# Patient Record
Sex: Female | Born: 1970 | Race: Black or African American | Hispanic: No | Marital: Married | Smoking: Never smoker
Health system: Southern US, Community
[De-identification: ages and names within clinical notes are randomized; demographics above are authoritative.]

## PROBLEM LIST (undated history)

## (undated) DIAGNOSIS — I10 Essential (primary) hypertension: Secondary | ICD-10-CM

## (undated) DIAGNOSIS — R002 Palpitations: Secondary | ICD-10-CM

## (undated) HISTORY — PX: CHOLECYSTECTOMY: SHX55

---

## 1998-02-11 ENCOUNTER — Inpatient Hospital Stay (HOSPITAL_COMMUNITY): Admission: AD | Admit: 1998-02-11 | Discharge: 1998-02-11 | Payer: Self-pay | Admitting: *Deleted

## 1998-02-11 ENCOUNTER — Inpatient Hospital Stay (HOSPITAL_COMMUNITY): Admission: AD | Admit: 1998-02-11 | Discharge: 1998-02-14 | Payer: Self-pay | Admitting: Obstetrics & Gynecology

## 1999-03-09 ENCOUNTER — Observation Stay (HOSPITAL_COMMUNITY): Admission: RE | Admit: 1999-03-09 | Discharge: 1999-03-10 | Payer: Self-pay | Admitting: General Surgery

## 1999-03-09 ENCOUNTER — Encounter (INDEPENDENT_AMBULATORY_CARE_PROVIDER_SITE_OTHER): Payer: Self-pay | Admitting: Specialist

## 1999-07-06 ENCOUNTER — Other Ambulatory Visit: Admission: RE | Admit: 1999-07-06 | Discharge: 1999-07-06 | Payer: Self-pay | Admitting: *Deleted

## 1999-09-06 ENCOUNTER — Encounter: Payer: Self-pay | Admitting: Obstetrics & Gynecology

## 1999-09-06 ENCOUNTER — Ambulatory Visit (HOSPITAL_COMMUNITY): Admission: RE | Admit: 1999-09-06 | Discharge: 1999-09-06 | Payer: Self-pay | Admitting: Obstetrics & Gynecology

## 1999-12-10 ENCOUNTER — Inpatient Hospital Stay (HOSPITAL_COMMUNITY): Admission: AD | Admit: 1999-12-10 | Discharge: 1999-12-10 | Payer: Self-pay | Admitting: Obstetrics and Gynecology

## 1999-12-11 ENCOUNTER — Inpatient Hospital Stay (HOSPITAL_COMMUNITY): Admission: AD | Admit: 1999-12-11 | Discharge: 1999-12-11 | Payer: Self-pay | Admitting: Obstetrics and Gynecology

## 1999-12-15 ENCOUNTER — Ambulatory Visit (HOSPITAL_COMMUNITY): Admission: RE | Admit: 1999-12-15 | Discharge: 1999-12-15 | Payer: Self-pay | Admitting: Obstetrics and Gynecology

## 1999-12-15 ENCOUNTER — Encounter: Payer: Self-pay | Admitting: Obstetrics and Gynecology

## 2000-01-30 ENCOUNTER — Inpatient Hospital Stay (HOSPITAL_COMMUNITY): Admission: AD | Admit: 2000-01-30 | Discharge: 2000-02-02 | Payer: Self-pay | Admitting: Obstetrics and Gynecology

## 2002-04-01 ENCOUNTER — Encounter: Payer: Self-pay | Admitting: Emergency Medicine

## 2002-04-01 ENCOUNTER — Inpatient Hospital Stay (HOSPITAL_COMMUNITY): Admission: EM | Admit: 2002-04-01 | Discharge: 2002-04-07 | Payer: Self-pay | Admitting: Emergency Medicine

## 2002-04-02 ENCOUNTER — Encounter: Payer: Self-pay | Admitting: Internal Medicine

## 2002-04-03 ENCOUNTER — Encounter: Payer: Self-pay | Admitting: Internal Medicine

## 2002-04-06 ENCOUNTER — Encounter (INDEPENDENT_AMBULATORY_CARE_PROVIDER_SITE_OTHER): Payer: Self-pay | Admitting: *Deleted

## 2003-02-07 ENCOUNTER — Emergency Department (HOSPITAL_COMMUNITY): Admission: EM | Admit: 2003-02-07 | Discharge: 2003-02-07 | Payer: Self-pay | Admitting: Emergency Medicine

## 2003-02-10 ENCOUNTER — Other Ambulatory Visit: Admission: RE | Admit: 2003-02-10 | Discharge: 2003-02-10 | Payer: Self-pay | Admitting: *Deleted

## 2003-10-08 ENCOUNTER — Inpatient Hospital Stay (HOSPITAL_COMMUNITY): Admission: AD | Admit: 2003-10-08 | Discharge: 2003-10-08 | Payer: Self-pay | Admitting: Obstetrics and Gynecology

## 2003-10-09 ENCOUNTER — Inpatient Hospital Stay (HOSPITAL_COMMUNITY): Admission: AD | Admit: 2003-10-09 | Discharge: 2003-10-11 | Payer: Self-pay | Admitting: Obstetrics and Gynecology

## 2003-11-24 ENCOUNTER — Ambulatory Visit (HOSPITAL_COMMUNITY): Admission: RE | Admit: 2003-11-24 | Discharge: 2003-11-24 | Payer: Self-pay | Admitting: Obstetrics and Gynecology

## 2006-09-09 ENCOUNTER — Emergency Department (HOSPITAL_COMMUNITY): Admission: EM | Admit: 2006-09-09 | Discharge: 2006-09-09 | Payer: Self-pay | Admitting: Emergency Medicine

## 2008-08-13 ENCOUNTER — Ambulatory Visit: Payer: Self-pay | Admitting: Diagnostic Radiology

## 2008-08-13 ENCOUNTER — Emergency Department (HOSPITAL_BASED_OUTPATIENT_CLINIC_OR_DEPARTMENT_OTHER): Admission: EM | Admit: 2008-08-13 | Discharge: 2008-08-13 | Payer: Self-pay | Admitting: Emergency Medicine

## 2010-07-14 NOTE — Op Note (Signed)
NAMEJAVONA, Erika Norris                         ACCOUNT NO.:  [id number]   MEDICAL RECORD NO.:  [id number]                   PATIENT TYPE:  INP   LOCATION:  4702                                 FACILITY:  [facility]   PHYSICIAN:  Casimiro Needle B. Sherene Sires, M.D. [facility]           DATE OF BIRTH:  Oct 10, 1970   DATE OF PROCEDURE:  04/06/2002  DATE OF DISCHARGE:                                 OPERATIVE REPORT   REFERRED BY:  Deirdre Peer. Polite, M.D.   PROCEDURE:  Fiberoptic bronchoscopy, diagnostic, with lavage.   SURGEON:  Charlaine Dalton. Sherene Sires, M.D.   INDICATIONS FOR PROCEDURE:  This is a 40 year old black female who never  smoked with a new onset of hemoptysis, with infiltrates in the right base  posteriorly (representing alveolar hemorrhage versus pneumonia).  Because  she has had persistent hemoptysis with no alternative explanation, a  bronchoscopy is felt to be indicated to explore the endobronchial anatomy,  specifically the airways leading to the right lower lobe, to be sure that  there is not an endobronchial source of bleeding.  The procedure was performed after a full discussion of the risks, benefits  and alternatives with the patient.   DESCRIPTION OF PROCEDURE:  She received a total of 50 mg of IV Demerol and 5  mg of IV Versed while being continuously monitored by surface  electrocardiogram and oximetry.  Using a standard fiberoptic bronchoscope,  the right naris was initially approached and was easily passed using  additional 2% lidocaine topically.  The oropharynx was well visualized, and  there were no apparent upper airway lesions, specifically no indication of  active upper airway bleeding or source identified.  Using an additional 1% lidocaine as needed, the entire tracheobronchial tree  was explored bilaterally with the following findings:  1. Trachea and all the major airways bilaterally were explored to the sixth     segment level, with no active bleeding identified.  2. The only  abnormality appeared in the posterior basilar segment of the     right lower lobe where there appeared to be a bloody mucus plug that     was nothing more than I believe a clot which was lavaged free.  This     particular segment was lavaged gently with adequate return of slightly     bloody fluid, but no evidence of pus.  In fact, none of the airways even     appeared significantly inflamed.   IMPRESSION:  Hemoptysis with a blood clot in the right lower lobe, posterior  basilar segment which may have simply been aspirated blood from another  source; however, no evidence of upper or lower airway source of bleeding,  nor purulence or inflammation.    RECOMMENDATIONS:  Therefore the explanation for this patient's hemoptysis  remains enigmatic, and we will need to follow her closely as an outpatient,  to be sure that she does not have recurrence.  For now  I would simply treat her as an acute community-acquired pneumonia as  you planned, and she does have my phone number to call for an office visit  within a week for a follow-up chest x-ray.                                                Charlaine Dalton. Sherene Sires, M.D. [facility]    MBW/MEDQ  D:  04/06/2002  T:  04/06/2002  Job:  [id number]   cc:   Deirdre Peer. Polite, M.D.  [address]  [place], [place] [address]  Fax: [phone]

## 2010-07-14 NOTE — Op Note (Signed)
NAMESEBASTIAN, DZIK             ACCOUNT NO.:  [id number]   MEDICAL RECORD NO.:  [id number]          PATIENT TYPE:  AMB   LOCATION:  SDC                           FACILITY:  [facility]   PHYSICIAN:  Maxie Better, M.D.DATE OF BIRTH:  1970/09/02   DATE OF PROCEDURE:  11/24/2003  DATE OF DISCHARGE:                                 OPERATIVE REPORT   PREOPERATIVE DIAGNOSIS:  Desires sterilization.   PROCEDURE:  Laparoscopic tubal ligation with bipolar cautery.   POSTOPERATIVE DIAGNOSIS:  Desires sterilization.   ANESTHESIA:  General.   SURGEON:  Maxie Better, M.D.   FINDINGS:  Normal retroverted uterus, normal tubes and ovaries, normal  appendix.  No evidence of endometriosis.   PROCEDURE IN DETAIL:  Under adequate general anesthesia, the patient was  placed in the dorsal lithotomy position.  She was sterilely prepped and  draped in the usual fashion and indwelling Foley catheter was placed.  Examination under anesthesia confirmed a retroverted uterus, no adnexal  masses could be appreciated.  A bivalve speculum was placed in the vagina.  A single-tooth tenaculum was placed in the anterior lip of the cervix.  An  acorn cannula was introduced into the cervical os and attached to the  tenaculum for manipulation of the uterus.  The bivalve speculum was removed,  0.25% Marcaine was injected infra-umbilically.  An infra-umbilical incision  was then made.  Veress needle was introduced and tested with good placement,  3.5 L of carbon dioxide was insufflated.  The Veress needle was removed.  A  disposable trocar was introduced without incident.  The lighted video  laparoscopic was then introduced, confirming entry into the abdomen without  incident.  The liver edge was inspected.  The patient was placed in  Trendelenburg.  The suprapubic incision was then made under direct  visualization.  A 5 mm port was placed.  A probe was then used to inspect  the pelvis.  Normal appendix was  noted.  Normal tubes and ovaries were  noted.  Prominent blood vessels were noted in the IP region on the right.  No endometria noted anteriorly or posteriorly.  Bipolar cautery was done  bilaterally in the midportion of both fallopian tubes and that was felt to  be satisfactory.  The suprapubic port was removed under direct  visualization.  The abdomen deflated.  The infraumbilical port was then  removed under direct visualization, the abdomen further deflated.  The  infraumbilical port was closed with a deep layer of stitch of 0 Vicryl  figure-of-eight.  The skin incisions were approximated using Dermabond.  The  instruments in the vagina were removed.   SPECIMENS:  None.   ESTIMATED BLOOD LOSS:  Minimal.   COMPLICATIONS:  None.   DISPOSITION:  The patient tolerated the procedure well and was transferred  to recovery room in stable condition.      Lad[place]/MEDQ  D:  11/24/2003  T:  11/24/2003  Job:  [id number]

## 2010-07-14 NOTE — H&P (Signed)
[facility]  Patient:    Erika Norris                      MRN: [id number] Adm. Date:  [id number] Attending:  Shaune Spittle Dictator:   Wynelle Bourgeois, CNM                         History and Physical  HISTORY OF PRESENT ILLNESS:   [name] is a 40 year old G2, para 1-0-0-1 at 40-2/7ths weeks, who presents to [facility]U with complaints of regular uterine contractions x 2 hours.  She denies any leaking or bleeding and reports positive fetal movement.  The pregnancy has been followed by the M.D. service and remarkable for: 1. History of PIH previous pregnancy.  2. History of positive GBS.  3. Low weight gain with an EFW in the 37th percentile.  PRENATAL LABORATORIES:        Hemoglobin 11.8, hematocrit 34.2, platelets 264. Blood type O positive.  Antibody screen negative.  Sickle cell negative.  RPR nonreactive.  Rubella immune.  HBsAg negative.  HIV nonreactive.  Pap test normal.  Gonorrhea negative.  Chlamydia negative.  AFP normal.  Glucose challenge normal.  OBSTETRICAL HISTORY:          Remarkable for a vaginal delivery at term with no complications, ? year (records unavailable).  MEDICAL HISTORY:              Remarkable for a a history of cholecystitis which was treated with a cholecystectomy, history of group B strep in previous pregnancy, history of PIH in a previous pregnancy.  FAMILY HISTORY:               Noncontributory.  SOCIAL HISTORY:               The patient is married to [place] who is involved and supportive.  She denies any alcohol, tobacco or drug use.  PHYSICAL EXAMINATION:  VITAL SIGNS:                  Vital signs are stable, patient is afebrile.  HEENT:                        Within normal limits.  NECK:                         Thyroid normal, not enlarged.  BREASTS:                      Soft, nontender, no masses.  CHEST:                        Clear to auscultation bilaterally.  HEART RATE:                    Regular rate and rhythm.  ABDOMEN:                      Gravid, vertex to Leop[place], EFM is reactive with positive accelerations, no decelerations.  Uterine contractions every four minutes, moderately strong.  PELVIC:                       Cervical exam was 4 cm, 90% effaced and -1 station which changed to 4-5 cm, 90% and -1 to 0 station after one hour of  walking.  EXTREMITIES:                  Within normal limits.  ASSESSMENT:                   1. Intrauterine pregnancy at 40-2/7ths weeks.                               2. Early labor.  PLAN:                         1. Admit to birthing suites per Dr. Pennie Rushing.                               2. Routine M.D. orders.                               3. Further orders per Dr. Pennie Rushing. DD:  01/31/00 TD:  01/31/00 Job: [id number]

## 2010-07-14 NOTE — Discharge Summary (Signed)
Erika Norris, Erika Norris                         ACCOUNT NO.:  [id number]   MEDICAL RECORD NO.:  [id number]                   PATIENT TYPE:  INP   LOCATION:  4702                                 FACILITY:  [facility]   PHYSICIAN:  Ashby Dawes. Polite, M.D.              DATE OF BIRTH:  [birth year]   DATE OF ADMISSION:  04/01/2002  DATE OF DISCHARGE:  04/07/2002                                 DISCHARGE SUMMARY   DISCHARGE DIAGNOSES:  1. Hemoptysis, unknown etiology.  2. Probable community acquired pneumonia.   DISCHARGE MEDICATIONS:  Augmentin 500 mg twice a day for four more days.   CONSULTATIONS:  Dr. Christinia Gully, pulmonology.   PROCEDURES AND STUDIES:  1. A chest x-ray April 01, 2002 - no active disease.  2. A CT of the chest with contrast on April 01, 2002 revealed an     infiltrate in the posterior right lower lobe.  No evidence of mass or     adenopathy.  3. Repeat chest x-ray April 02, 2002 - stable chest, right lower lobe     infiltrate, seen by CT is barely discernable.  4. 12  lead EKG April 03, 2002 - normal sinus rhythm.  5. Fiberoptic bronchoscopy April 06, 2002 by Dr. Clois Comber.  Impression:     Hemoptysis with a blood clot in the right lower lobe posterior basilar     segment which may have simply been aspirated blood from another source     however, no evidence of upper or lower airway source of bleeding nor     purulent or inflammation.  Recommendations:  To treat as an acute     community acquired pneumonia.  Followup with a chest x-ray within one     week.   LABORATORY DATA:  Hemoglobin 10.4, hematocrit 30.3, sodium 137, potassium  3.8, chloride 109, CO2 23, glucose 111, BUN 6, creatinine 0.8.  ESR 15.  Urinalysis negative.  ANA negative.  ANCA negative.  ANCA/GBM  negative.   DISPOSITION:  The patient will be discharged home.   CONDITION ON DISCHARGE:  Stable.   HISTORY OF PRESENT ILLNESS:  This is a 40 year old female who presented to  [facility] ED  with complaints of coughing up blood on several occasions on  the day of admission associated with some shortness of breath.  The patient  did report recent sick contact with her daughter and her mother-in-law  approximately one week prior.  The patient reported occasional use of  aspirin for menstrual cramps.  The initial CT of the chest revealed patchy  infiltrate in the right lower lobe, no mass or effusion.  The patient was  admitted for further evaluation and treatment.   HOSPITAL COURSE:  1. HEMOPTYSIS.  Initial differential diagnoses include an infection,     pulmonary mass, collagen vascular disease and possible PE.  The initial     CT revealed patchy infiltrate of the  right lower lobe, no mass or     effusion.  Rheumatologic work up was initiated.  Work up came back     negative.  Coag's were checked and were within normal limits.  Empiric     antibiotic therapy was initiated.  Pulmonary consult was obtained.  The     patient was seen in consultation by Dr. Christinia Gully.  A fiberoptic     bronchoscopy was performed on April 06, 2002.  No active bleeding was     identified.  There was a blood and mucus plug in the posterior basilar     segment of the right lower lobe and it was lavaged free.  There was no     noted pus or inflammation.  Recommendations were to treat as an acute     community acquired pneumonia.  We treated the patient for community     acquired pneumonia.  Will followup an office visit and chest x-ray within     one week.  The patient had no further hemoptysis throughout her     hospitalization, no shortness of breath.  2. PROBABLE COMMUNITY ACQUIRED PNEUMONIA.  Initially the patient was started     on __________  IV every eight hours.  The patient remained afebrile     during her hospitalization.  She has been discharged on an additional     four days of antibiotic therapy to complete a 10 day antibiotic course.     She will have a repeat chest x-ray in one  week.   DISCHARGE INSTRUCTIONS AND FOLLOWUP:  She has a followup appointment with  her primary care physician, Dr. Jacklynn Ganong, on February 18.  She  should followup in Dr. Gustavus Bryant office in one week with a followup chest x-  ray.     Stephanie Martinique, NP                      Ashby Dawes. Polite, M.D.    SJ/MEDQ  D:  04/07/2002  T:  04/07/2002  Job:  [id number]   cc:   Legrand Como B. Melvyn Novas, M.D. [facility]  [address] [address]  Fax: 1

## 2011-04-11 ENCOUNTER — Emergency Department (HOSPITAL_COMMUNITY)
Admission: EM | Admit: 2011-04-11 | Discharge: 2011-04-11 | Disposition: A | Discharge status: Home / Self Care | Payer: 59 | Attending: Emergency Medicine | Admitting: Emergency Medicine

## 2011-04-11 ENCOUNTER — Emergency Department (HOSPITAL_COMMUNITY): Payer: 59

## 2011-04-11 ENCOUNTER — Encounter (HOSPITAL_COMMUNITY): Payer: Self-pay | Admitting: *Deleted

## 2011-04-11 DIAGNOSIS — M545 Low back pain, unspecified: Secondary | ICD-10-CM | POA: Insufficient documentation

## 2011-04-11 DIAGNOSIS — M542 Cervicalgia: Secondary | ICD-10-CM | POA: Insufficient documentation

## 2011-04-11 DIAGNOSIS — M546 Pain in thoracic spine: Secondary | ICD-10-CM | POA: Insufficient documentation

## 2011-04-11 DIAGNOSIS — Z79899 Other long term (current) drug therapy: Secondary | ICD-10-CM | POA: Insufficient documentation

## 2011-04-11 DIAGNOSIS — M25519 Pain in unspecified shoulder: Secondary | ICD-10-CM | POA: Insufficient documentation

## 2011-04-11 DIAGNOSIS — I10 Essential (primary) hypertension: Secondary | ICD-10-CM | POA: Insufficient documentation

## 2011-04-11 HISTORY — DX: Essential (primary) hypertension: I10

## 2011-04-11 HISTORY — DX: Palpitations: R00.2

## 2011-04-11 MED ORDER — IBUPROFEN 800 MG PO TABS: 800.0000 mg | ORAL_TABLET | Freq: Three times a day (TID) | ORAL | Status: AC | PRN

## 2011-04-11 MED ORDER — HYDROCODONE-ACETAMINOPHEN 5-325 MG PO TABS: 1.0000 | ORAL_TABLET | Freq: Four times a day (QID) | ORAL | Status: AC | PRN

## 2011-04-11 MED ORDER — OXYCODONE-ACETAMINOPHEN 5-325 MG PO TABS
1.0000 | ORAL_TABLET | Freq: Once | ORAL | Status: AC
  Administered 2011-04-11: 1 via ORAL
  Filled 2011-04-11: qty 1

## 2011-04-11 MED ORDER — DIAZEPAM 5 MG PO TABS
5.0000 mg | ORAL_TABLET | Freq: Once | ORAL | Status: AC
  Administered 2011-04-11: 5 mg via ORAL
  Filled 2011-04-11: qty 1

## 2011-04-11 MED ORDER — DIAZEPAM 5 MG PO TABS: 5.0000 mg | ORAL_TABLET | Freq: Three times a day (TID) | ORAL | Status: AC | PRN

## 2011-04-11 MED ORDER — IBUPROFEN 200 MG PO TABS
600.0000 mg | ORAL_TABLET | Freq: Once | ORAL | Status: AC
  Administered 2011-04-11: 600 mg via ORAL
  Filled 2011-04-11: qty 2
  Filled 2011-04-11: qty 1
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
## 2011-04-11 NOTE — ED Provider Notes (Signed)
Medical screening examination/treatment/procedure(s) were performed by non-physician practitioner and as supervising physician I was immediately available for consultation/collaboration.   Berlynn Warsame, MD 04/11/11 1616 

## 2014-09-20 ENCOUNTER — Ambulatory Visit: Payer: BLUE CROSS/BLUE SHIELD | Admitting: Skilled Nursing Facility1

## 2017-04-24 ENCOUNTER — Ambulatory Visit (INDEPENDENT_AMBULATORY_CARE_PROVIDER_SITE_OTHER): Payer: Self-pay | Admitting: Orthopedic Surgery

## 2017-04-24 ENCOUNTER — Encounter (INDEPENDENT_AMBULATORY_CARE_PROVIDER_SITE_OTHER): Payer: Self-pay | Admitting: Orthopedic Surgery

## 2017-04-24 ENCOUNTER — Ambulatory Visit (INDEPENDENT_AMBULATORY_CARE_PROVIDER_SITE_OTHER): Payer: Self-pay

## 2017-04-24 DIAGNOSIS — M25562 Pain in left knee: Secondary | ICD-10-CM

## 2017-04-24 NOTE — Progress Notes (Signed)
Office Visit Note   Patient: Erika Norris           Date of Birth: 12/06/1970           MRN: [id number] Visit Date: 04/24/2017 Requested by: Juanita Lasteripton, John S, MD [address] [place], [place] [id number] PCP: Juanita Lasteripton, John S, MD  Subjective: Chief Complaint  Patient presents with  . Left Knee - Pain    HPI: Erika Norris is a 47 year old patient with left knee pain.  Reports pain since early December after walking on a brick sidewalk.  Her foot got caught and she twisted her knee.  She had initial swelling but that swelling improved.  Now she reports recurrent swelling and pain.  She recently tried to start exercising for weight loss but had to stop because that made her knee pain increase.  This pain does wake her from sleep at night.  She reports weakness and swelling and tightness.  Hard for her to bend the knee back.  Reports occasional locking but more pain and swelling.  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              ROS: All systems reviewed are negative as they relate to the chief complaint within the history of present illness.  Patient denies  fevers or chills.   Assessment & Plan: Visit Diagnoses:  1. Left knee pain, unspecified chronicity     Plan: Impression is left knee pain and effusion with relatively normal radiographs.  Does have a history of pulmonary embolism in 2004.  Aspiration and injection of the left knee is performed today.  She also needs MRI scan to evaluate for chondral defect versus meniscal pathology.  I could not get a great examination on her ACL but it felt stable on the exam I got.  Follow-Up Instructions: Return for after MRI.   Orders:  Orders Placed This Encounter  Procedures  . XR KNEE 3 VIEW LEFT  . MR Knee Left w/o contrast   No orders of the defined types were placed in this encounter.     Procedures: No procedures performed   Clinical Data: No additional findings.  Objective: Vital Signs: There  were no vitals taken for this visit.  Physical Exam:   Constitutional: Patient appears well-developed HEENT:  Head: Normocephalic Eyes:EOM are normal Neck: Normal range of motion Cardiovascular: Normal rate Pulmonary/chest: Effort normal Neurologic: Patient is alert Skin: Skin is warm Psychiatric: Patient has normal mood and affect    Ortho Exam: Orthopedic exam demonstrates normal gait and alignment.  Does have moderate left knee effusion.  ACL feels stable but it is difficult to assess due to guarding.  Collateral cruciate ligaments intact and extensor mechanism is intact.  Pedal pulses palpable.  No other masses lymphadenopathy or skin changes noted in that left knee region.  McMurray compression testing equivocal.  PCL is intact.  Specialty Comments:  No specialty comments available.  Imaging: Xr Knee 3 View Left  Result Date: 04/24/2017 AP lateral merchant left knee reviewed.  No fracture or dislocation is seen.  Varus alignment is present.  No effusion is present.  Mild spurring off the medial tibial plateau on the left knee.    PMFS History: There are no active problems to display for this patient.  Past Medical History:  Diagnosis Date  . Heart palpitations   . Hypertension     History reviewed. No pertinent family history.  Past Surgical History:  Procedure  Laterality Date  . CHOLECYSTECTOMY     Social History   Occupational History  . Not on file  Tobacco Use  . Smoking status: Never Smoker  Substance and Sexual Activity  . Alcohol use: Yes    Comment: occ  . Drug use: No  . Sexual activity: Not on file

## 2017-05-11 ENCOUNTER — Other Ambulatory Visit: Payer: Self-pay

## 2017-05-14 ENCOUNTER — Ambulatory Visit
Admission: RE | Admit: 2017-05-14 | Discharge: 2017-05-14 | Disposition: A | Discharge status: Home / Self Care | Payer: No Typology Code available for payment source | Source: Ambulatory Visit | Attending: Orthopedic Surgery | Admitting: Orthopedic Surgery

## 2017-05-14 DIAGNOSIS — M25562 Pain in left knee: Secondary | ICD-10-CM

## 2017-05-17 ENCOUNTER — Ambulatory Visit (INDEPENDENT_AMBULATORY_CARE_PROVIDER_SITE_OTHER): Payer: Self-pay | Admitting: Orthopedic Surgery

## 2017-05-22 ENCOUNTER — Ambulatory Visit (INDEPENDENT_AMBULATORY_CARE_PROVIDER_SITE_OTHER): Payer: Self-pay | Admitting: Orthopedic Surgery

## 2017-05-22 ENCOUNTER — Encounter (INDEPENDENT_AMBULATORY_CARE_PROVIDER_SITE_OTHER): Payer: Self-pay | Admitting: Orthopedic Surgery

## 2017-05-22 DIAGNOSIS — M25562 Pain in left knee: Secondary | ICD-10-CM

## 2017-05-22 NOTE — Progress Notes (Signed)
   Office Visit Note   Patient: Erika BarkerLynnell Norris           Date of Birth: 01/07/1971           MRN: [id number] Visit Date: 05/22/2017 Requested by: Juanita Lasteripton, John S, MD [address] [place], [place] [id number] PCP: Juanita Lasteripton, John S, MD  Subjective: Chief Complaint  Patient presents with  . Left Knee - Follow-up    HPI: Patient presents for follow-up of left knee pain.  Since I have seen her she has had an MRI scan.  She states that after the aspiration and injection she is doing better.  Currently taking no medication for the problem.  She does report some global pain but it is manageable at this time.  MRI scan shows meniscal root degeneration without discrete tear.  Fairly minimal arthritis in the remaining portion of her knee              ROS: All systems reviewed are negative as they relate to the chief complaint within the history of present illness.  Patient denies  fevers or chills.   Assessment & Plan: Visit Diagnoses:  1. Left knee pain, unspecified chronicity     Plan: Impression is left knee pain meniscal root degeneration.  Plan is observation and activity modification to avoid excessive loadbearing.  She is at risk for having a complete meniscal root tear.  Increase in pain and swelling would be indications for further evaluation.  I will see her back as needed  Follow-Up Instructions: Return if symptoms worsen or fail to improve.   Orders:  No orders of the defined types were placed in this encounter.  No orders of the defined types were placed in this encounter.     Procedures: No procedures performed   Clinical Data: No additional findings.  Objective: Vital Signs: There were no vitals taken for this visit.  Physical Exam:   Constitutional: Patient appears well-developed HEENT:  Head: Normocephalic Eyes:EOM are normal Neck: Normal range of motion Cardiovascular: Normal rate Pulmonary/chest: Effort normal Neurologic: Patient is alert Skin:  Skin is warm Psychiatric: Patient has normal mood and affect    Ortho Exam: Orthopedic exam demonstrates full range of motion of the left knee with no effusion.  No real focal joint line tenderness is present.  Patella mobility is good.  Collateral cruciate ligaments are stable.  Pedal pulses palpable.  Specialty Comments:  No specialty comments available.  Imaging: No results found.   PMFS History: There are no active problems to display for this patient.  Past Medical History:  Diagnosis Date  . Heart palpitations   . Hypertension     History reviewed. No pertinent family history.  Past Surgical History:  Procedure Laterality Date  . CHOLECYSTECTOMY     Social History   Occupational History  . Not on file  Tobacco Use  . Smoking status: Never Smoker  . Smokeless tobacco: Never Used  Substance and Sexual Activity  . Alcohol use: Yes    Comment: occ  . Drug use: No  . Sexual activity: Not on file

## 2018-09-16 IMAGING — MR MR KNEE*L* W/O CM
5 series · 33 of 40 positions shown · non-contrast
Comparison: None.

CLINICAL DATA: Left knee pain and swelling since January 2017. No
known injury.

EXAM:
MRI OF THE LEFT KNEE WITHOUT CONTRAST
TECHNIQUE: Multiplanar, multisequence MR imaging of the knee was performed. No
intravenous contrast was administered.

[Series 8: PD fat-sat · axial · left · 3.5mm · 0.39mm/px · z∈[-38,+92]mm · 8 of 32 slices shown (1 of 3)]
[im 1/32]
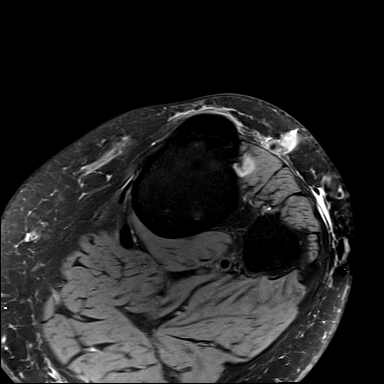
[im 5/32]
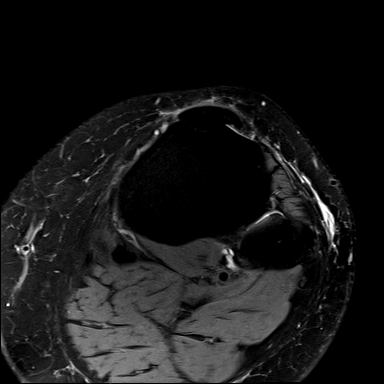
[im 9/32]
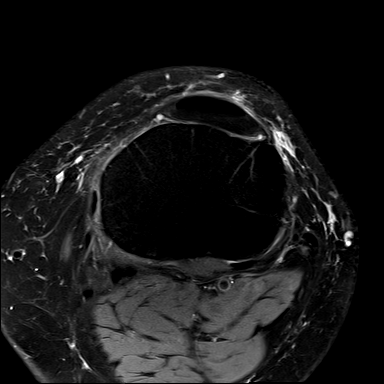
[im 14/32]
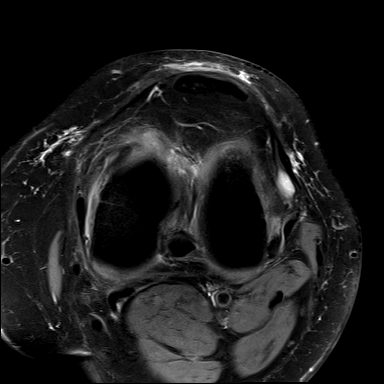
[im 18/32]
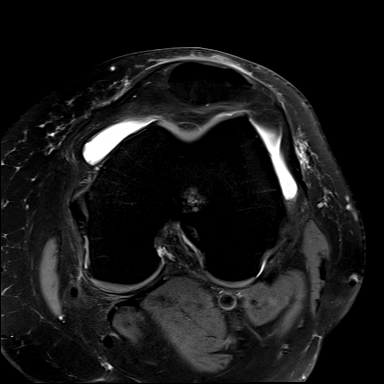
[im 23/32]
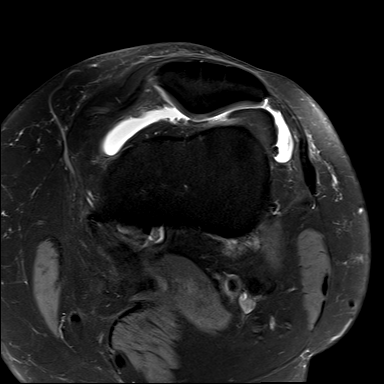
[im 27/32]
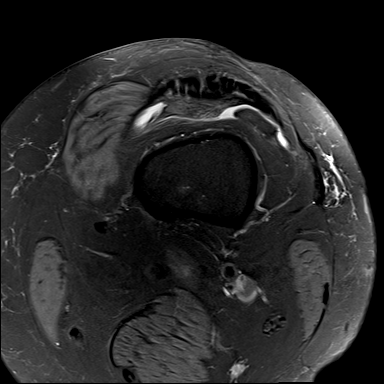
[im 32/32]
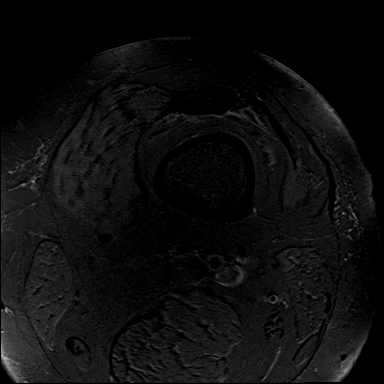

[Series 9: [id number] · coronal · left · 3.5mm · 0.39mm/px · 1 of 26 slices shown]
[im 1/26]
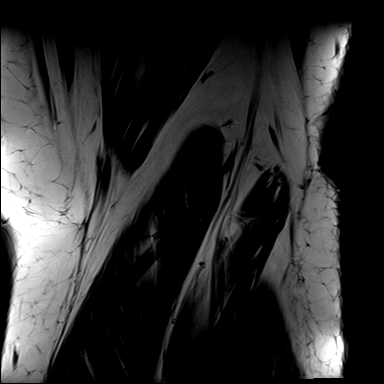

[Series 10: PD fat-sat · coronal · left · 3.5mm · 0.39mm/px · 8 of 26 slices shown (2 of 3)]
[im 1/26]
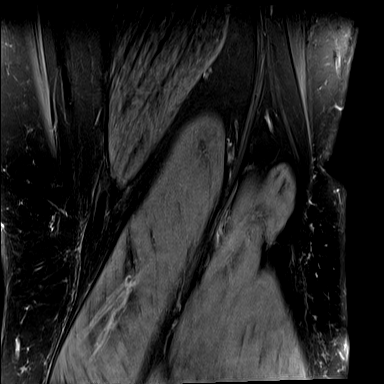
[im 4/26]
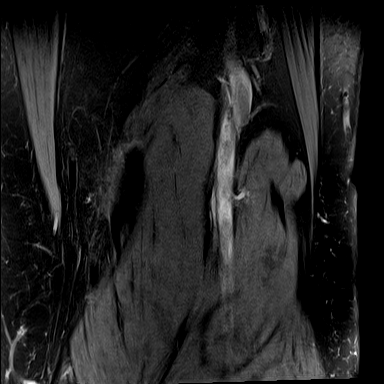
[im 8/26]
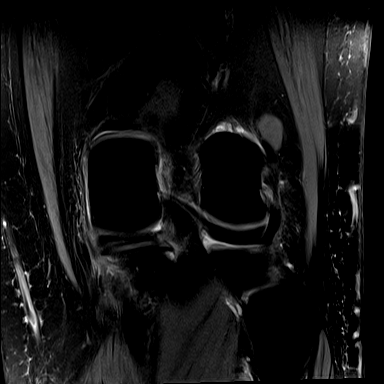
[im 11/26]
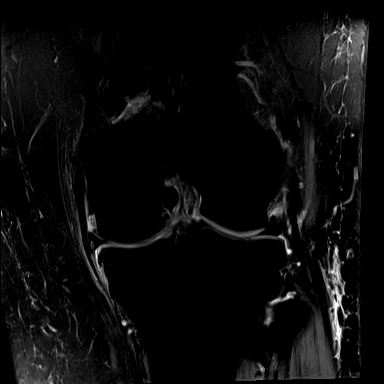
[im 15/26]
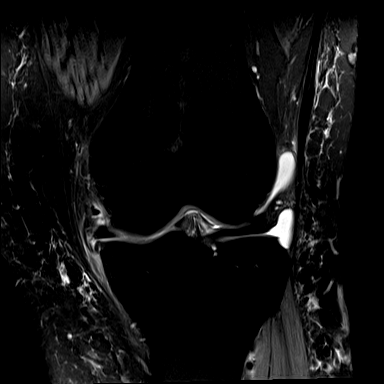
[im 18/26]
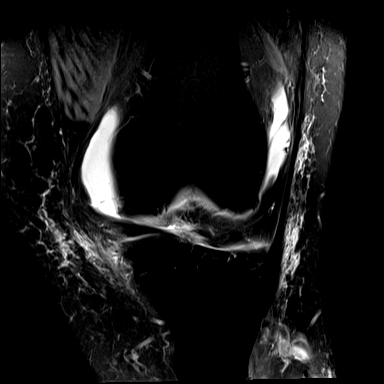
[im 22/26]
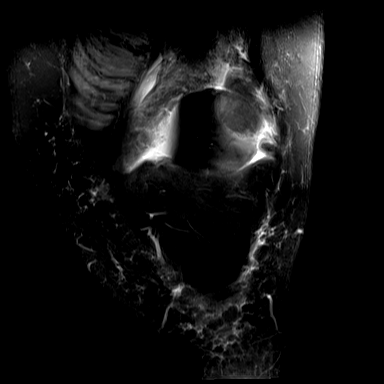
[im 26/26]
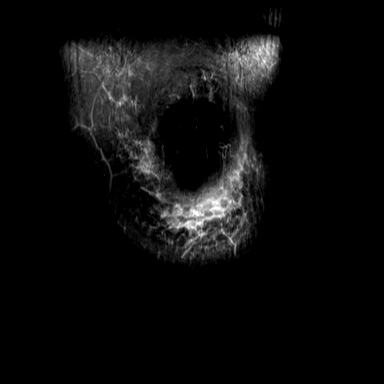

[Series 11: PD fat-sat · sagittal · left · 3.3mm · 0.39mm/px · 8 of 27 slices shown (3 of 3)]
[im 1/27]
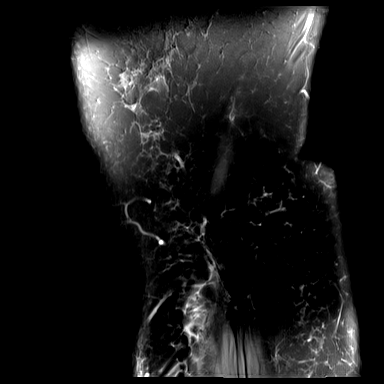
[im 4/27]
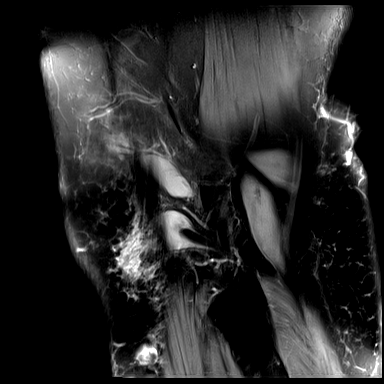
[im 8/27]
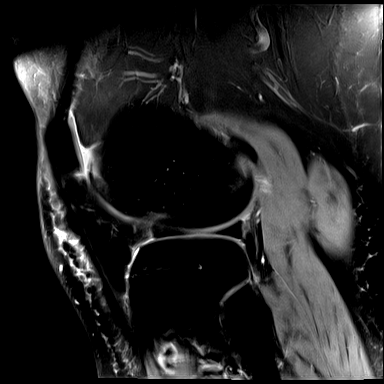
[im 12/27]
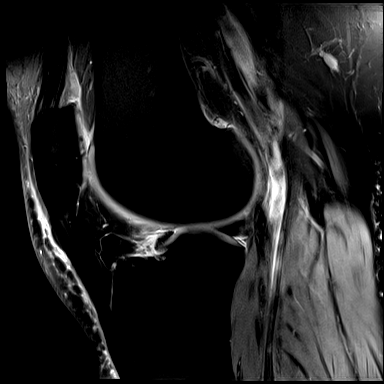
[im 15/27]
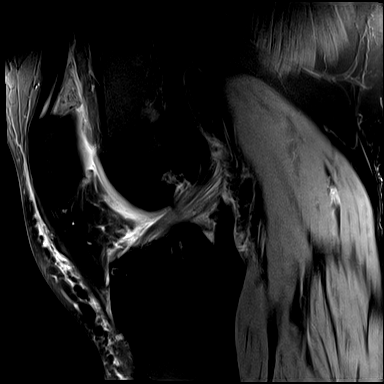
[im 19/27]
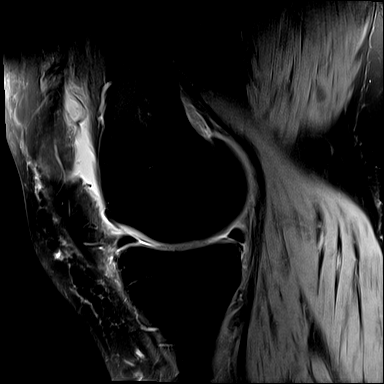
[im 23/27]
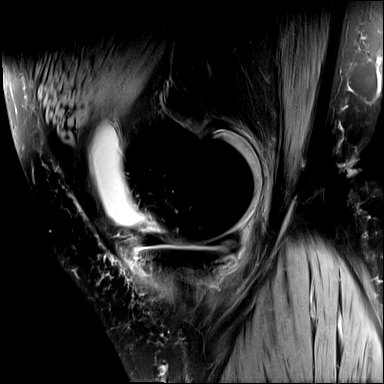
[im 27/27]
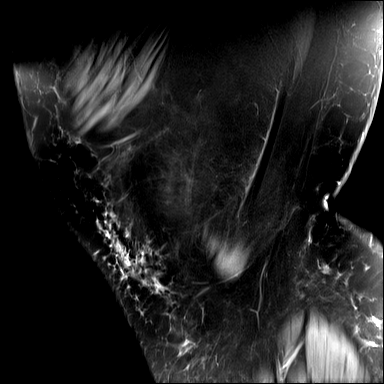

[Series 12: T2 fat-sat · coronal · left · 3.5mm · 0.39mm/px · 8 of 26 slices shown]
[im 1/26]
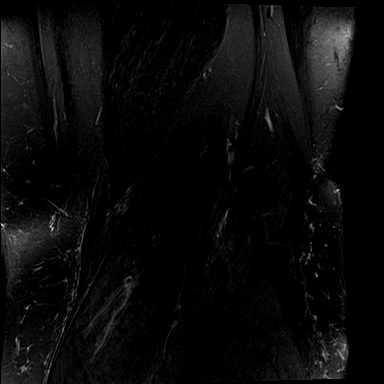
[im 4/26]
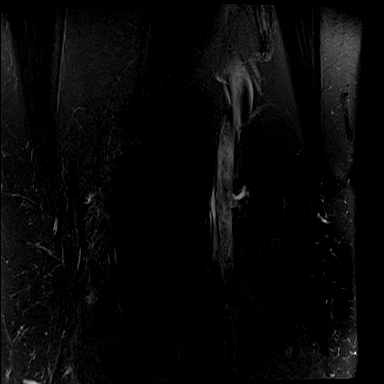
[im 8/26]
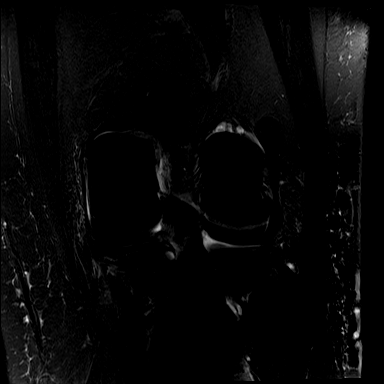
[im 11/26]
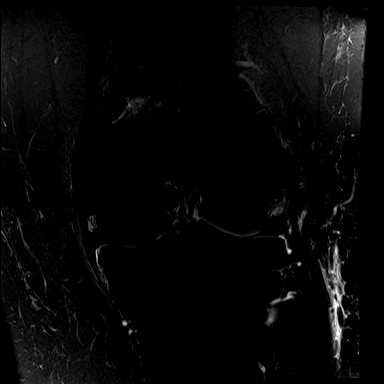
[im 15/26]
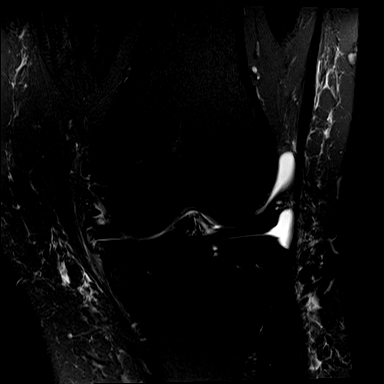
[im 18/26]
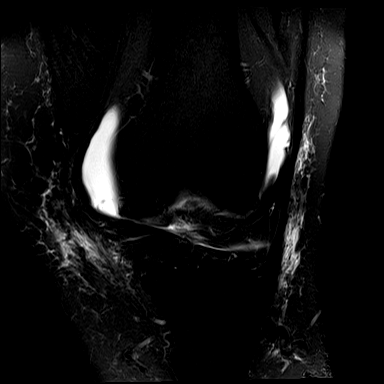
[im 22/26]
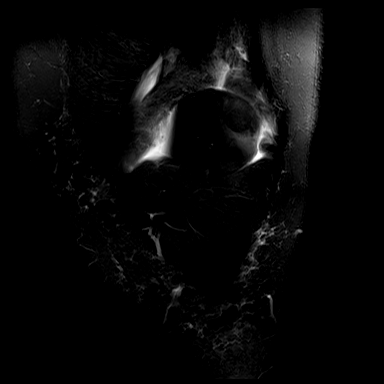
[im 26/26]
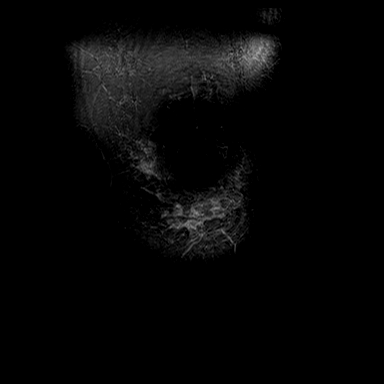

[33 of 40 positions shown; findings below may reference images not displayed]

FINDINGS: MENISCI

Medial meniscus: Irregularity and increased signal within the
posterior root, concerning for tear. Borderline extrusion of the
body. Degenerative signal within the body/posterior horn junction
without discrete tear

Lateral meniscus:  Intact.

LIGAMENTS

Cruciates:  Intact ACL and PCL.

Collaterals: Medial collateral ligament is intact. Lateral
collateral ligament complex is intact.

CARTILAGE

Patellofemoral: Focal partial-thickness fissuring over the patellar
apex extending into the lateral facet. High-grade focal
partial-thickness cartilage loss over the medial trochlea.

Medial: Thinning of the cartilage over the central weight-bearing
medial femoral condyle and medial tibial plateau. No focal defect.

Lateral: Focal high-grade partial-thickness cartilage loss along the
anterolateral femoral condyle with underlying subchondral marrow
edema. Fissuring over the posterior lateral tibial plateau.

Joint: Small joint effusion. Normal Hoffa's fat. No plical
thickening.

Popliteal Fossa:  No Baker cyst. Intact popliteus tendon.

Extensor Mechanism: Intact quadriceps tendon and patellar tendon.
Intact medial and lateral patellar retinaculum. Intact MPFL.

Bones: No fracture or dislocation. Small tricompartmental
osteophytes.

Other: Mild subcutaneous edema along the anterior knee.
IMPRESSION: 1. Complex tear of the medial meniscus posterior root with
borderline extrusion of the body.
2. Mild tricompartmental osteoarthritis with cartilage abnormalities
as described above.
3. Small joint effusion.

## 2019-03-23 ENCOUNTER — Ambulatory Visit: Payer: No Typology Code available for payment source | Attending: Internal Medicine

## 2019-03-23 DIAGNOSIS — Z20822 Contact with and (suspected) exposure to covid-19: Secondary | ICD-10-CM

## 2019-03-24 LAB — NOVEL CORONAVIRUS, NAA: SARS-CoV-2, NAA: NOT DETECTED
